# Patient Record
Sex: Female | Born: 1975 | Race: Black or African American | Hispanic: No | Marital: Single | Smoking: Never smoker
Health system: Southern US, Community
[De-identification: ages and names within clinical notes are randomized; demographics above are authoritative.]

---

## 2015-05-12 ENCOUNTER — Emergency Department (HOSPITAL_BASED_OUTPATIENT_CLINIC_OR_DEPARTMENT_OTHER)
Admission: EM | Admit: 2015-05-12 | Discharge: 2015-05-12 | Disposition: A | Discharge status: Home / Self Care | Payer: Self-pay | Attending: Emergency Medicine | Admitting: Emergency Medicine

## 2015-05-12 ENCOUNTER — Encounter (HOSPITAL_BASED_OUTPATIENT_CLINIC_OR_DEPARTMENT_OTHER): Payer: Self-pay | Admitting: Emergency Medicine

## 2015-05-12 ENCOUNTER — Emergency Department (HOSPITAL_BASED_OUTPATIENT_CLINIC_OR_DEPARTMENT_OTHER): Payer: Self-pay

## 2015-05-12 DIAGNOSIS — Z79899 Other long term (current) drug therapy: Secondary | ICD-10-CM | POA: Insufficient documentation

## 2015-05-12 DIAGNOSIS — R059 Cough, unspecified: Secondary | ICD-10-CM

## 2015-05-12 DIAGNOSIS — J40 Bronchitis, not specified as acute or chronic: Secondary | ICD-10-CM

## 2015-05-12 DIAGNOSIS — J209 Acute bronchitis, unspecified: Secondary | ICD-10-CM | POA: Insufficient documentation

## 2015-05-12 DIAGNOSIS — R05 Cough: Secondary | ICD-10-CM

## 2015-05-12 LAB — BASIC METABOLIC PANEL
ANION GAP: 7 (ref 5–15)
BUN: 9 mg/dL (ref 6–20)
CHLORIDE: 106 mmol/L (ref 101–111)
CO2: 25 mmol/L (ref 22–32)
Calcium: 8.9 mg/dL (ref 8.9–10.3)
Creatinine, Ser: 0.7 mg/dL (ref 0.44–1.00)
GFR calc Af Amer: >60 mL/min (ref >60)
GLUCOSE: 88 mg/dL (ref 65–99)
POTASSIUM: 3.8 mmol/L (ref 3.5–5.1)
Sodium: 138 mmol/L (ref 135–145)

## 2015-05-12 LAB — CBC WITH DIFFERENTIAL/PLATELET
BASOS ABS: 0 10*3/uL (ref 0.0–0.1)
Basophils Relative: 0 %
Eosinophils Absolute: 0.2 10*3/uL (ref 0.0–0.7)
Eosinophils Relative: 4 %
HEMATOCRIT: 41.8 % (ref 36.0–46.0)
Hemoglobin: 13.5 g/dL (ref 12.0–15.0)
LYMPHS ABS: 3.2 10*3/uL (ref 0.7–4.0)
LYMPHS PCT: 63 %
MCH: 28.6 pg (ref 26.0–34.0)
MCHC: 32.3 g/dL (ref 30.0–36.0)
MCV: 88.6 fL (ref 78.0–100.0)
MONO ABS: 0.5 10*3/uL (ref 0.1–1.0)
MONOS PCT: 9 %
NEUTROS ABS: 1.2 10*3/uL — AB (ref 1.7–7.7)
Neutrophils Relative %: 24 %
Platelets: 221 10*3/uL (ref 150–400)
RBC: 4.72 MIL/uL (ref 3.87–5.11)
RDW: 13.4 % (ref 11.5–15.5)
WBC: 5 10*3/uL (ref 4.0–10.5)

## 2015-05-12 MED ORDER — BENZONATATE 100 MG PO CAPS: 100.0000 mg | ORAL_CAPSULE | Freq: Three times a day (TID) | ORAL | Status: DC | PRN

## 2015-05-12 MED ORDER — IBUPROFEN 800 MG PO TABS: 800.0000 mg | ORAL_TABLET | Freq: Three times a day (TID) | ORAL | Status: DC | PRN

## 2015-05-12 MED ORDER — AZITHROMYCIN 250 MG PO TABS: 250.0000 mg | ORAL_TABLET | Freq: Every day | ORAL | Status: DC

## 2015-05-12 MED ORDER — ALBUTEROL SULFATE HFA 108 (90 BASE) MCG/ACT IN AERS
2.0000 | INHALATION_SPRAY | Freq: Once | RESPIRATORY_TRACT | Status: AC
  Administered 2015-05-12: 2 via RESPIRATORY_TRACT
  Filled 2015-05-12: qty 6.7

## 2015-05-12 MED ORDER — PREDNISONE 20 MG PO TABS: ORAL_TABLET | ORAL | Status: DC

## 2015-05-12 NOTE — ED Provider Notes (Signed)
CSN: [id number]     Arrival date & time 05/12/15  1342 History   First MD Initiated Contact with Patient 05/12/15 1511     Chief Complaint  Patient presents with  . Cough     (Consider location/radiation/quality/duration/timing/severity/associated sxs/prior Treatment) The history is provided by the patient.     Pt p/w dry cough x 2-3 months worse in the past week with addition of subjective fevers.  Denies other URI symptoms, hemoptysis, night sweats, leg swelling, recent immobilization, exogenous estrogen, recent travel.  Denies reflux hx. Denies allergies.  Her 40 year old daughter is currently also sick with cough but has only been sick this week.  Pt is originally from [place], has been in this country for 20 years.    History reviewed. No pertinent past medical history. History reviewed. No pertinent past surgical history. History reviewed. No pertinent family history. Social History  Substance Use Topics  . Smoking status: Never Smoker   . Smokeless tobacco: None  . Alcohol Use: No   OB History    No data available     Review of Systems  All other systems reviewed and are negative.     Allergies  Review of patient's allergies indicates no known allergies.  Home Medications   Prior to Admission medications   Medication Sig Start Date End Date Taking? Authorizing Provider  dextromethorphan-guaiFENesin (MUCINEX DM) 30-600 MG 12hr tablet Take 1 tablet by mouth 2 (two) times daily.   Yes Historical Provider, MD  sodium-potassium bicarbonate (ALKA-SELTZER GOLD) TBEF dissolvable tablet Take 1 tablet by mouth daily as needed.   Yes Historical Provider, MD   BP 129/79 mmHg  Pulse 80  Temp(Src) 98 F (36.7 C) (Oral)  Resp 20  Ht 5\' 2"  (1.575 m)  Wt 85.503 kg  BMI 34.47 kg/m2  SpO2 100%  LMP 04/14/2015 Physical Exam  Constitutional: She appears well-developed and well-nourished. No distress.  HENT:  Head: Normocephalic and atraumatic.  Neck: Neck supple.   Cardiovascular: Normal rate and regular rhythm.   Pulmonary/Chest: Effort normal and breath sounds normal. No respiratory distress. She has no wheezes. She has no rales.  Occasional cough  Abdominal: Soft. She exhibits no distension. There is no tenderness. There is no rebound and no guarding.  Musculoskeletal: She exhibits no edema.  Neurological: She is alert.  Skin: She is not diaphoretic.  Nursing note and vitals reviewed.   ED Course  Procedures (including critical care time) Labs Review Labs Reviewed  CBC WITH DIFFERENTIAL/PLATELET - Abnormal; Notable for the following:    Neutro Abs 1.2 (*)    All other components within normal limits  BASIC METABOLIC PANEL    Imaging Review Dg Chest 2 View  05/12/2015  CLINICAL DATA:  Cough, congestion for 2-3 months. EXAM: CHEST  2 VIEW COMPARISON:  None. FINDINGS: The heart size and mediastinal contours are within normal limits. Both lungs are clear. The visualized skeletal structures are unremarkable. IMPRESSION: No active cardiopulmonary disease. Electronically Signed   By: Charlett NoseKevin  Dover M.D.   On: 05/12/2015 14:24   I have personally reviewed and evaluated these images and lab results as part of my medical decision-making.   EKG Interpretation None       4:00 PM Discussed pt with Dr Madilyn Hookees who recommends basic labs, if negative f/u with health department for PPD.    MDM   Final diagnoses:  Cough  Bronchitis    Afebrile, nontoxic patient with 2-3 months of cough, now with subjective fever.  CXR, labs unremarkable.   D/C home with tessalon, albuterol, z-pak, prednisone, health department/PCP follow up.  Pt is originally from [place], has been here 20 years.  No night sweats, hemoptysis, fevers.  Pt is not ill appearing.  No risk factors for PE.  Discussed result, findings, treatment, and follow up  with patient.  Pt given return precautions.  Pt verbalizes understanding and agrees with plan.         Trixie Dredge,  PA-C 05/12/15 1723  Tilden Fossa, MD 05/13/15 [phone]

## 2015-05-12 NOTE — ED Notes (Signed)
Cough x 3 months

## 2015-05-12 NOTE — Discharge Instructions (Signed)
Read the information below.  Use the prescribed medication as directed.  Please discuss all new medications with your pharmacist.  You may return to the Emergency Department at any time for worsening condition or any new symptoms that concern you.   If you develop shortness of breath, wheezing, severe chest pain, or fevers despite using tylenol and/or ibuprofen, return for a recheck.       Cough, Adult Coughing is a reflex that clears your throat and your airways. Coughing helps to heal and protect your lungs. It is normal to cough occasionally, but a cough that happens with other symptoms or lasts a long time may be a sign of a condition that needs treatment. A cough may last only 2-3 weeks (acute), or it may last longer than 8 weeks (chronic). CAUSES Coughing is commonly caused by:  Breathing in substances that irritate your lungs.  A viral or bacterial respiratory infection.  Allergies.  Asthma.  Postnasal drip.  Smoking.  Acid backing up from the stomach into the esophagus (gastroesophageal reflux).  Certain medicines.  Chronic lung problems, including COPD (or rarely, lung cancer).  Other medical conditions such as heart failure. HOME CARE INSTRUCTIONS  Pay attention to any changes in your symptoms. Take these actions to help with your discomfort:  Take medicines only as told by your health care provider.  If you were prescribed an antibiotic medicine, take it as told by your health care provider. Do not stop taking the antibiotic even if you start to feel better.  Talk with your health care provider before you take a cough suppressant medicine.  Drink enough fluid to keep your urine clear or pale yellow.  If the air is dry, use a cold steam vaporizer or humidifier in your bedroom or your home to help loosen secretions.  Avoid anything that causes you to cough at work or at home.  If your cough is worse at night, try sleeping in a semi-upright position.  Avoid  cigarette smoke. If you smoke, quit smoking. If you need help quitting, ask your health care provider.  Avoid caffeine.  Avoid alcohol.  Rest as needed. SEEK MEDICAL CARE IF:   You have new symptoms.  You cough up pus.  Your cough does not get better after 2-3 weeks, or your cough gets worse.  You cannot control your cough with suppressant medicines and you are losing sleep.  You develop pain that is getting worse or pain that is not controlled with pain medicines.  You have a fever.  You have unexplained weight loss.  You have night sweats. SEEK IMMEDIATE MEDICAL CARE IF:  You cough up blood.  You have difficulty breathing.  Your heartbeat is very fast.   This information is not intended to replace advice given to you by your health care provider. Make sure you discuss any questions you have with your health care provider.   Document Released: 10/28/2010 Document Revised: 01/20/2015 Document Reviewed: 07/08/2014 Elsevier Interactive Patient Education [employer]2016 Elsevier Inc.

## 2015-06-05 ENCOUNTER — Emergency Department (HOSPITAL_BASED_OUTPATIENT_CLINIC_OR_DEPARTMENT_OTHER)
Admission: EM | Admit: 2015-06-05 | Discharge: 2015-06-05 | Disposition: A | Discharge status: Home / Self Care | Payer: Self-pay | Attending: Emergency Medicine | Admitting: Emergency Medicine

## 2015-06-05 ENCOUNTER — Encounter (HOSPITAL_BASED_OUTPATIENT_CLINIC_OR_DEPARTMENT_OTHER): Payer: Self-pay | Admitting: *Deleted

## 2015-06-05 ENCOUNTER — Emergency Department (HOSPITAL_BASED_OUTPATIENT_CLINIC_OR_DEPARTMENT_OTHER): Payer: Self-pay

## 2015-06-05 DIAGNOSIS — R053 Chronic cough: Secondary | ICD-10-CM

## 2015-06-05 DIAGNOSIS — R05 Cough: Secondary | ICD-10-CM | POA: Insufficient documentation

## 2015-06-05 DIAGNOSIS — Z79899 Other long term (current) drug therapy: Secondary | ICD-10-CM | POA: Insufficient documentation

## 2015-06-05 MED ORDER — ALBUTEROL SULFATE HFA 108 (90 BASE) MCG/ACT IN AERS
2.0000 | INHALATION_SPRAY | Freq: Once | RESPIRATORY_TRACT | Status: AC
  Administered 2015-06-05: 2 via RESPIRATORY_TRACT
  Filled 2015-06-05: qty 6.7

## 2015-06-05 MED ORDER — LIDOCAINE VISCOUS 2 % MT SOLN
15.0000 mL | Freq: Once | OROMUCOSAL | Status: AC
  Administered 2015-06-05: 15 mL via OROMUCOSAL
  Filled 2015-06-05: qty 15

## 2015-06-05 MED ORDER — ALBUTEROL SULFATE HFA 108 (90 BASE) MCG/ACT IN AERS: 1.0000 | INHALATION_SPRAY | Freq: Four times a day (QID) | RESPIRATORY_TRACT | Status: DC | PRN

## 2015-06-05 MED ORDER — PANTOPRAZOLE SODIUM 20 MG PO TBEC: 40.0000 mg | DELAYED_RELEASE_TABLET | Freq: Every day | ORAL | Status: DC

## 2015-06-05 NOTE — ED Notes (Signed)
Pt reports chronic cough x5 mos. Reports being seen 2-3 wks ago and taking a course of antibiotics. Denies fever; reports intermittent sob and recent nasal congestion.

## 2015-06-05 NOTE — ED Provider Notes (Signed)
CSN: [id number]     Arrival date & time 06/05/15  1328 History   First MD Initiated Contact with Patient 06/05/15 1419     Chief Complaint  Patient presents with  . Cough     (Consider location/radiation/quality/duration/timing/severity/associated sxs/prior Treatment) Patient is a 40 y.o. female presenting with cough.  Cough Cough characteristics:  Productive Sputum characteristics:  Clear Severity:  Severe Onset quality:  Gradual Duration: 5 months. Timing:  Constant Progression:  Worsening Chronicity:  Chronic Context: upper respiratory infection (if coughing a lot, runny nose, sore throat)   Context: not sick contacts   Context comment:  No TB exposures, no recent travel/long flight Relieved by: 5 days of steroids. Worsened by:  Nothing tried Ineffective treatments:  None tried Associated symptoms: no chest pain, no ear pain, no fever, no headaches, no rash, no rhinorrhea, no shortness of breath, no sinus congestion, no sore throat, no weight loss and no wheezing     History reviewed. No pertinent past medical history. Past Surgical History  Procedure Laterality Date  . Cesarean section  2009   No family history on file. Social History  Substance Use Topics  . Smoking status: Never Smoker   . Smokeless tobacco: None  . Alcohol Use: No   OB History    No data available     Review of Systems  Constitutional: Negative for fever and weight loss.  HENT: Negative for ear pain, rhinorrhea and sore throat.   Eyes: Negative for visual disturbance.  Respiratory: Positive for cough. Negative for shortness of breath and wheezing.   Cardiovascular: Negative for chest pain.  Gastrointestinal: Negative for nausea, vomiting and abdominal pain.  Genitourinary: Negative for difficulty urinating.  Musculoskeletal: Negative for back pain and neck pain.  Skin: Negative for rash.  Neurological: Negative for syncope and headaches.      Allergies  Review of patient's allergies  indicates no known allergies.  Home Medications   Prior to Admission medications   Medication Sig Start Date End Date Taking? Authorizing Provider  benzonatate (TESSALON) 100 MG capsule Take 1 capsule (100 mg total) by mouth 3 (three) times daily as needed for cough. 05/12/15  Yes Trixie Dredge, PA-C  albuterol (PROVENTIL HFA;VENTOLIN HFA) 108 (90 Base) MCG/ACT inhaler Inhale 1-2 puffs into the lungs every 6 (six) hours as needed for wheezing or shortness of breath. 06/05/15   Alvira Monday, MD  azithromycin (ZITHROMAX) 250 MG tablet Take 1 tablet (250 mg total) by mouth daily. Take first 2 tablets together, then 1 every day until finished. 05/12/15   Trixie Dredge, PA-C  dextromethorphan-guaiFENesin [facility] DM) 30-600 MG 12hr tablet Take 1 tablet by mouth 2 (two) times daily.    Historical Provider, MD  ibuprofen (ADVIL,MOTRIN) 800 MG tablet Take 1 tablet (800 mg total) by mouth every 8 (eight) hours as needed for mild pain or moderate pain. 05/12/15   Trixie Dredge, PA-C  pantoprazole (PROTONIX) 20 MG tablet Take 2 tablets (40 mg total) by mouth daily. 06/05/15   Alvira Monday, MD  predniSONE (DELTASONE) 20 MG tablet 2 tabs po daily x 5 days 05/12/15   Trixie Dredge, PA-C  sodium-potassium bicarbonate (ALKA-SELTZER GOLD) TBEF dissolvable tablet Take 1 tablet by mouth daily as needed.    Historical Provider, MD   BP 123/77 mmHg  Pulse 96  Temp(Src) 98.1 F (36.7 C) (Oral)  Resp 16  Ht  (1.651 m)  Wt 188 lb (85.276 kg)  BMI 31.28 kg/m2  SpO2 96%  LMP 05/08/2015 (Approximate)  Physical Exam  Constitutional: She is oriented to person, place, and time. She appears well-developed and well-nourished. No distress.  HENT:  Head: Normocephalic and atraumatic.  Eyes: Conjunctivae and EOM are normal.  Neck: Normal range of motion.  Cardiovascular: Normal rate, regular rhythm, normal heart sounds and intact distal pulses.  Exam reveals no gallop and no friction rub.   No murmur  heard. Pulmonary/Chest: Effort normal and breath sounds normal. No respiratory distress. She has no wheezes. She has no rales.  Frequent cough   Abdominal: Soft. She exhibits no distension. There is no tenderness. There is no guarding.  Musculoskeletal: She exhibits no edema or tenderness.  Neurological: She is alert and oriented to person, place, and time.  Skin: Skin is warm and dry. No rash noted. She is not diaphoretic. No erythema.  Nursing note and vitals reviewed.   ED Course  Procedures (including critical care time) Labs Review Labs Reviewed - No data to display  Imaging Review Dg Chest 2 View  06/05/2015  CLINICAL DATA:  Chronic cough for 5 months, took antibiotics 2-3 weeks ago, no fever, no improvement with symptoms, intermittent shortness of breath EXAM: CHEST  2 VIEW COMPARISON:  05/12/2015 FINDINGS: The heart size and mediastinal contours are within normal limits. Both lungs are clear. The visualized skeletal structures are unremarkable. IMPRESSION: No active cardiopulmonary disease. Electronically Signed   By: Esperanza Heir M.D.   On: 06/05/2015 14:58   I have personally reviewed and evaluated these images and lab results as part of my medical decision-making.   EKG Interpretation None      MDM   Final diagnoses:  Chronic cough   40 year old female with no significant medical history presents with concern of cough for 5 months. Patient was seen previously in the emergency department, given prednisone, azithromycin and Tessalon Perles, and reports that her symptoms temporarily had improved.  Patient does not have any signs of CHF on exam, history does not suggest pulmonary embolus.  Have low suspicion for tuberculosis, given patient has a clear chest x-ray, no fevers, no night sweats, no weight loss, no known contact with TB, and [place].  Patient has a sister with asthma, and given chronic cough with bronchospastic cough as  possibility, she was given an albuterol inhaler. In addition patient was given a prescription for Protonix given possibility of reflux as cause as cough and throat irritation, worse at night. Recommended close PCP follow-up. Patient discharged in stable condition with understanding of reasons to return.    Alvira Monday, MD 06/05/15 2210

## 2015-11-12 ENCOUNTER — Emergency Department (HOSPITAL_BASED_OUTPATIENT_CLINIC_OR_DEPARTMENT_OTHER)
Admission: EM | Admit: 2015-11-12 | Discharge: 2015-11-12 | Disposition: A | Discharge status: Home / Self Care | Payer: Self-pay | Attending: Emergency Medicine | Admitting: Emergency Medicine

## 2015-11-12 ENCOUNTER — Encounter (HOSPITAL_BASED_OUTPATIENT_CLINIC_OR_DEPARTMENT_OTHER): Payer: Self-pay | Admitting: *Deleted

## 2015-11-12 ENCOUNTER — Emergency Department (HOSPITAL_BASED_OUTPATIENT_CLINIC_OR_DEPARTMENT_OTHER): Payer: Self-pay

## 2015-11-12 DIAGNOSIS — R1032 Left lower quadrant pain: Secondary | ICD-10-CM

## 2015-11-12 DIAGNOSIS — B373 Candidiasis of vulva and vagina: Secondary | ICD-10-CM | POA: Insufficient documentation

## 2015-11-12 DIAGNOSIS — B3731 Acute candidiasis of vulva and vagina: Secondary | ICD-10-CM

## 2015-11-12 DIAGNOSIS — B9689 Other specified bacterial agents as the cause of diseases classified elsewhere: Secondary | ICD-10-CM

## 2015-11-12 DIAGNOSIS — N760 Acute vaginitis: Secondary | ICD-10-CM | POA: Insufficient documentation

## 2015-11-12 DIAGNOSIS — N3090 Cystitis, unspecified without hematuria: Secondary | ICD-10-CM | POA: Insufficient documentation

## 2015-11-12 LAB — COMPREHENSIVE METABOLIC PANEL
ALK PHOS: 59 U/L (ref 38–126)
ALT: 17 U/L (ref 14–54)
ANION GAP: 6 (ref 5–15)
AST: 25 U/L (ref 15–41)
Albumin: 4.2 g/dL (ref 3.5–5.0)
BILIRUBIN TOTAL: 0.6 mg/dL (ref 0.3–1.2)
BUN: 10 mg/dL (ref 6–20)
CALCIUM: 9.1 mg/dL (ref 8.9–10.3)
CO2: 25 mmol/L (ref 22–32)
Chloride: 106 mmol/L (ref 101–111)
Creatinine, Ser: 0.88 mg/dL (ref 0.44–1.00)
Glucose, Bld: 101 mg/dL — ABNORMAL HIGH (ref 65–99)
POTASSIUM: 3.5 mmol/L (ref 3.5–5.1)
Sodium: 137 mmol/L (ref 135–145)
TOTAL PROTEIN: 7 g/dL (ref 6.5–8.1)

## 2015-11-12 LAB — CBC WITH DIFFERENTIAL/PLATELET
BASOS ABS: 0 10*3/uL (ref 0.0–0.1)
BASOS PCT: 0 %
Eosinophils Absolute: 0.3 10*3/uL (ref 0.0–0.7)
Eosinophils Relative: 4 %
HEMATOCRIT: 39.6 % (ref 36.0–46.0)
HEMOGLOBIN: 13.3 g/dL (ref 12.0–15.0)
LYMPHS PCT: 44 %
Lymphs Abs: 2.7 10*3/uL (ref 0.7–4.0)
MCH: 29.6 pg (ref 26.0–34.0)
MCHC: 33.6 g/dL (ref 30.0–36.0)
MCV: 88.2 fL (ref 78.0–100.0)
MONO ABS: 0.5 10*3/uL (ref 0.1–1.0)
Monocytes Relative: 7 %
NEUTROS ABS: 2.8 10*3/uL (ref 1.7–7.7)
NEUTROS PCT: 45 %
Platelets: 205 10*3/uL (ref 150–400)
RBC: 4.49 MIL/uL (ref 3.87–5.11)
RDW: 13.9 % (ref 11.5–15.5)
WBC: 6.2 10*3/uL (ref 4.0–10.5)

## 2015-11-12 LAB — WET PREP, GENITAL
Sperm: NONE SEEN
TRICH WET PREP: NONE SEEN

## 2015-11-12 LAB — URINALYSIS, ROUTINE W REFLEX MICROSCOPIC
BILIRUBIN URINE: NEGATIVE
GLUCOSE, UA: NEGATIVE mg/dL
KETONES UR: NEGATIVE mg/dL
Nitrite: NEGATIVE
PH: 5.5 (ref 5.0–8.0)
PROTEIN: 30 mg/dL — AB
Specific Gravity, Urine: 1.024 (ref 1.005–1.030)

## 2015-11-12 LAB — URINE MICROSCOPIC-ADD ON

## 2015-11-12 LAB — PREGNANCY, URINE: Preg Test, Ur: NEGATIVE

## 2015-11-12 MED ORDER — PROBIOTIC & ACIDOPHILUS EX ST PO CAPS: 1.0000 | ORAL_CAPSULE | Freq: Every day | ORAL | Status: DC

## 2015-11-12 MED ORDER — SODIUM CHLORIDE 0.9 % IV BOLUS (SEPSIS): 1000.0000 mL | Freq: Once | INTRAVENOUS | Status: DC

## 2015-11-12 MED ORDER — SULFAMETHOXAZOLE-TRIMETHOPRIM 800-160 MG PO TABS
1.0000 | ORAL_TABLET | Freq: Once | ORAL | Status: AC
  Administered 2015-11-12: 1 via ORAL
  Filled 2015-11-12: qty 1

## 2015-11-12 MED ORDER — FLUCONAZOLE 50 MG PO TABS
150.0000 mg | ORAL_TABLET | Freq: Once | ORAL | Status: AC
  Administered 2015-11-12: 150 mg via ORAL
  Filled 2015-11-12: qty 1

## 2015-11-12 MED ORDER — METRONIDAZOLE 500 MG PO TABS: 500.0000 mg | ORAL_TABLET | Freq: Two times a day (BID) | ORAL | Status: DC

## 2015-11-12 MED ORDER — METRONIDAZOLE 500 MG PO TABS
500.0000 mg | ORAL_TABLET | Freq: Once | ORAL | Status: AC
  Administered 2015-11-12: 500 mg via ORAL
  Filled 2015-11-12: qty 1

## 2015-11-12 MED ORDER — SULFAMETHOXAZOLE-TRIMETHOPRIM 800-160 MG PO TABS: 1.0000 | ORAL_TABLET | Freq: Two times a day (BID) | ORAL | Status: AC

## 2015-11-12 NOTE — ED Notes (Signed)
Back and abdominal pain. Dysuria since last night.

## 2015-11-12 NOTE — ED Provider Notes (Signed)
CSN: [id number]     Arrival date & time 11/12/15  1505 History   First MD Initiated Contact with Patient 11/12/15 1545     Chief Complaint  Patient presents with  . Abdominal Pain   PT IS A 40 YO BF WHO C/O ABDOMINAL PAIN.  PT C/O LLQ PAIN WITH PAIN RADIATING TO HER BACK.  THE PT DENIES ANY N/V. THE PT DOES C/O DYSURIA.  PT'S LMP WAS 5/4.  SHE STARTED SPOTTING TODAY.  (Consider location/radiation/quality/duration/timing/severity/associated sxs/prior Treatment) Patient is a 40 y.o. female presenting with abdominal pain. The history is provided by the patient.  Abdominal Pain Pain location:  LLQ Pain quality: sharp   Pain radiates to:  L flank Pain severity:  Moderate Onset quality:  Sudden Timing:  Constant Progression:  Unchanged   History reviewed. No pertinent past medical history. Past Surgical History  Procedure Laterality Date  . Cesarean section  2009   No family history on file. Social History  Substance Use Topics  . Smoking status: Never Smoker   . Smokeless tobacco: None  . Alcohol Use: No   OB History    No data available     Review of Systems  Gastrointestinal: Positive for abdominal pain.  All other systems reviewed and are negative.     Allergies  Review of patient's allergies indicates no known allergies.  Home Medications   Prior to Admission medications   Medication Sig Start Date End Date Taking? Authorizing Provider  albuterol (PROVENTIL HFA;VENTOLIN HFA) 108 (90 Base) MCG/ACT inhaler Inhale 1-2 puffs into the lungs every 6 (six) hours as needed for wheezing or shortness of breath. 06/05/15   Alvira Monday, MD  azithromycin (ZITHROMAX) 250 MG tablet Take 1 tablet (250 mg total) by mouth daily. Take first 2 tablets together, then 1 every day until finished. 05/12/15   Trixie Dredge, PA-C  benzonatate (TESSALON) 100 MG capsule Take 1 capsule (100 mg total) by mouth 3 (three) times daily as needed for cough. 05/12/15   Trixie Dredge, PA-C   dextromethorphan-guaiFENesin [facility] DM) 30-600 MG 12hr tablet Take 1 tablet by mouth 2 (two) times daily.    Historical Provider, MD  ibuprofen (ADVIL,MOTRIN) 800 MG tablet Take 1 tablet (800 mg total) by mouth every 8 (eight) hours as needed for mild pain or moderate pain. 05/12/15   Trixie Dredge, PA-C  metroNIDAZOLE (FLAGYL) 500 MG tablet Take 1 tablet (500 mg total) by mouth 2 (two) times daily. 11/12/15   Jacalyn Lefevre, MD  pantoprazole (PROTONIX) 20 MG tablet Take 2 tablets (40 mg total) by mouth daily. 06/05/15   Alvira Monday, MD  predniSONE (DELTASONE) 20 MG tablet 2 tabs po daily x 5 days 05/12/15   Trixie Dredge, PA-C  Probiotic Product (PROBIOTIC & ACIDOPHILUS EX ST) CAPS Take 1 capsule by mouth daily. 11/12/15   Jacalyn Lefevre, MD  sodium-potassium bicarbonate (ALKA-SELTZER GOLD) TBEF dissolvable tablet Take 1 tablet by mouth daily as needed.    Historical Provider, MD  sulfamethoxazole-trimethoprim (BACTRIM DS,SEPTRA DS) 800-160 MG tablet Take 1 tablet by mouth 2 (two) times daily. 11/12/15 11/19/15  Jacalyn Lefevre, MD   BP 108/80 mmHg  Pulse 66  Temp(Src) 98.3 F (36.8 C) (Oral)  Resp 18  Ht  (1.676 m)  Wt 188 lb (85.276 kg)  BMI 30.36 kg/m2  SpO2 100%  LMP 09/17/2015 Physical Exam  Constitutional: She is oriented to person, place, and time. She appears well-developed and well-nourished.  HENT:  Head: Normocephalic and atraumatic.  Right Ear:  External ear normal.  Left Ear: External ear normal.  Nose: Nose normal.  Mouth/Throat: Oropharynx is clear and moist.  Eyes: Conjunctivae and EOM are normal. Pupils are equal, round, and reactive to light.  Neck: Normal range of motion. Neck supple.  Cardiovascular: Normal rate, regular rhythm, normal heart sounds and intact distal pulses.   Pulmonary/Chest: Effort normal and breath sounds normal.  Abdominal: Soft. Bowel sounds are normal. There is tenderness in the suprapubic area and left lower quadrant.  Genitourinary: Uterus  is tender. Left adnexum displays tenderness. There is bleeding in the vagina.  Musculoskeletal: Normal range of motion.  Neurological: She is alert and oriented to person, place, and time.  Skin: Skin is warm and dry.  Psychiatric: She has a normal mood and affect. Her behavior is normal. Judgment and thought content normal.  Nursing note and vitals reviewed.   ED Course  Procedures (including critical care time) Labs Review Labs Reviewed  WET PREP, GENITAL - Abnormal; Notable for the following:    Yeast Wet Prep HPF POC PRESENT (*)    Clue Cells Wet Prep HPF POC PRESENT (*)    WBC, Wet Prep HPF POC MODERATE (*)    All other components within normal limits  URINALYSIS, ROUTINE W REFLEX MICROSCOPIC (NOT AT [facility]) - Abnormal; Notable for the following:    Color, Urine AMBER (*)    APPearance CLOUDY (*)    Hgb urine dipstick LARGE (*)    Protein, ur 30 (*)    Leukocytes, UA MODERATE (*)    All other components within normal limits  COMPREHENSIVE METABOLIC PANEL - Abnormal; Notable for the following:    Glucose, Bld 101 (*)    All other components within normal limits  URINE MICROSCOPIC-ADD ON - Abnormal; Notable for the following:    Squamous Epithelial / LPF 0-5 (*)    Bacteria, UA FEW (*)    All other components within normal limits  PREGNANCY, URINE  CBC WITH DIFFERENTIAL/PLATELET  GC/CHLAMYDIA PROBE AMP ([facility]) NOT AT [facility]    Imaging Review Ct Renal Stone Study  11/12/2015  CLINICAL DATA:  Left lower quadrant pain EXAM: CT ABDOMEN AND PELVIS WITHOUT CONTRAST TECHNIQUE: Multidetector CT imaging of the abdomen and pelvis was performed following the standard protocol without IV contrast. COMPARISON:  None. FINDINGS: Lower chest:  Lung bases clear.  Heart size normal Hepatobiliary: Liver normal in size and contour without focal lesion. Gallbladder and bile ducts normal. Pancreas: Negative Spleen: Negative Adrenals/Urinary Tract: No renal calculi. No renal obstruction or mass.  Thickening of the urinary bladder may represent cystitis. Stomach/Bowel: Stomach and duodenum normal. Negative for bowel obstruction. Normal appendix. Negative for diverticulitis. No colonic edema. Vascular/Lymphatic: Normal aorta and IVC.  No lymphadenopathy. Reproductive: Prior C-section with scarring in the lower uterine segment. No pelvic mass lesion. 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 No edema in the hernia. Musculoskeletal: No skeletal abnormality.  Normal lumbar spine IMPRESSION: Negative for urinary tract calculi. Bladder wall thickening compatible with cystitis. Normal appendix.  Negative for diverticulitis. Ventral hernia in the midline without acute abnormality. Electronically Signed   By: Marlan Palauharles  Clark M.D.   On: 11/12/2015 18:04   I have personally reviewed and evaluated these images and lab results as part of my medical decision-making.   EKG Interpretation None      MDM  Pt is feeling better.  [facility]  She knows to return if worse. Final  diagnoses:  Cystitis  Bacterial vaginosis  Vaginal candidiasis  Left lower quadrant pain        Jacalyn LefevreJulie Torey Reinard, MD 11/12/15 [phone]

## 2015-11-12 NOTE — Discharge Instructions (Signed)

## 2015-11-12 NOTE — ED Notes (Signed)
Pt reports LLQ pain radiating to her back since yesterday. Pt reports a missed period earlier this month but reports some spotting started today. Reports nausea, no vomiting, some burning with urination.

## 2015-11-12 NOTE — ED Notes (Signed)
Pt given gingerale and crackers 

## 2015-11-15 LAB — GC/CHLAMYDIA PROBE AMP (~~LOC~~) NOT AT ARMC
Chlamydia: NEGATIVE
NEISSERIA GONORRHEA: NEGATIVE

## 2016-10-24 ENCOUNTER — Encounter (HOSPITAL_BASED_OUTPATIENT_CLINIC_OR_DEPARTMENT_OTHER): Payer: Self-pay | Admitting: Emergency Medicine

## 2016-10-24 ENCOUNTER — Emergency Department (HOSPITAL_BASED_OUTPATIENT_CLINIC_OR_DEPARTMENT_OTHER)
Admission: EM | Admit: 2016-10-24 | Discharge: 2016-10-24 | Disposition: A | Discharge status: Home / Self Care | Payer: Self-pay | Attending: Emergency Medicine | Admitting: Emergency Medicine

## 2016-10-24 DIAGNOSIS — R300 Dysuria: Secondary | ICD-10-CM | POA: Insufficient documentation

## 2016-10-24 DIAGNOSIS — N730 Acute parametritis and pelvic cellulitis: Secondary | ICD-10-CM

## 2016-10-24 DIAGNOSIS — N739 Female pelvic inflammatory disease, unspecified: Secondary | ICD-10-CM | POA: Insufficient documentation

## 2016-10-24 LAB — URINALYSIS, ROUTINE W REFLEX MICROSCOPIC
BILIRUBIN URINE: NEGATIVE
GLUCOSE, UA: NEGATIVE mg/dL
Ketones, ur: NEGATIVE mg/dL
Nitrite: NEGATIVE
PH: 6 (ref 5.0–8.0)
Protein, ur: NEGATIVE mg/dL
SPECIFIC GRAVITY, URINE: 1.014 (ref 1.005–1.030)

## 2016-10-24 LAB — URINALYSIS, MICROSCOPIC (REFLEX)

## 2016-10-24 LAB — WET PREP, GENITAL
Sperm: NONE SEEN
Trich, Wet Prep: NONE SEEN

## 2016-10-24 LAB — PREGNANCY, URINE: PREG TEST UR: NEGATIVE

## 2016-10-24 MED ORDER — CEFTRIAXONE SODIUM 250 MG IJ SOLR
250.0000 mg | Freq: Once | INTRAMUSCULAR | Status: AC
  Administered 2016-10-24: 250 mg via INTRAMUSCULAR
  Filled 2016-10-24: qty 250

## 2016-10-24 MED ORDER — HYDROCODONE-ACETAMINOPHEN 5-325 MG PO TABS
1.0000 | ORAL_TABLET | Freq: Once | ORAL | Status: AC
  Administered 2016-10-24: 1 via ORAL
  Filled 2016-10-24: qty 1

## 2016-10-24 MED ORDER — DOXYCYCLINE HYCLATE 100 MG PO CAPS: 100.0000 mg | ORAL_CAPSULE | Freq: Two times a day (BID) | ORAL | 0 refills | Status: AC

## 2016-10-24 MED ORDER — METRONIDAZOLE 500 MG PO TABS: 500.0000 mg | ORAL_TABLET | Freq: Two times a day (BID) | ORAL | 0 refills | Status: AC

## 2016-10-24 MED ORDER — FLUCONAZOLE 100 MG PO TABS
150.0000 mg | ORAL_TABLET | Freq: Once | ORAL | Status: AC
  Administered 2016-10-24: 17:00:00 150 mg via ORAL
  Filled 2016-10-24: qty 1

## 2016-10-24 MED ORDER — IBUPROFEN 600 MG PO TABS: 600.0000 mg | ORAL_TABLET | Freq: Four times a day (QID) | ORAL | 0 refills | Status: AC | PRN

## 2016-10-24 NOTE — Discharge Instructions (Signed)

## 2016-10-24 NOTE — ED Provider Notes (Signed)
[facility] Provider Note   CSN: [id number] Arrival date & time: 10/24/16  1618     History   Chief Complaint Chief Complaint  Patient presents with  . Dysuria  . Abdominal Pain    HPI Colleen Meza is a 41 y.o. female.  HPI   41 yo F with PMHx as below here with a 3-week history of lower abd pain and vaginal discharge with dysuria. Sx started 3 weeks ago with mild burning with urination and lower abdominal pain. Over the past few weeks, she has had persistent, worsening pain with associated foul-smelling vaginal discharge and mild dyspareunia. She has h/o UTIs but denies h/o STDs, though she is sexually active. No bleeding. Pain worse with urination, no alleviating factors. No fevers. No RUQ pain.  History reviewed. No pertinent past medical history.  There are no active problems to display for this patient.   Past Surgical History:  Procedure Laterality Date  . CESAREAN SECTION  2009    OB History    No data available       Home Medications    Prior to Admission medications   Medication Sig Start Date End Date Taking? Authorizing Provider  dextromethorphan-guaiFENesin (MUCINEX DM) 30-600 MG 12hr tablet Take 1 tablet by mouth 2 (two) times daily.    [provider]  doxycycline (VIBRAMYCIN) 100 MG capsule Take 1 capsule (100 mg total) by mouth 2 (two) times daily. 10/24/16 11/07/16  Shaune PollackIsaacs, Yavuz Kirby, MD  ibuprofen (ADVIL,MOTRIN) 600 MG tablet Take 1 tablet (600 mg total) by mouth every 6 (six) hours as needed. 10/24/16   Shaune PollackIsaacs, Indy Kuck, MD  metroNIDAZOLE (FLAGYL) 500 MG tablet Take 1 tablet (500 mg total) by mouth 2 (two) times daily. 10/24/16 11/07/16  Shaune PollackIsaacs, Arnola Crittendon, MD  sodium-potassium bicarbonate (ALKA-SELTZER GOLD) TBEF dissolvable tablet Take 1 tablet by mouth daily as needed.    [provider]    Family History No family history on file.  Social History Social History  Substance Use Topics  . Smoking status: Never  Smoker  . Smokeless tobacco: Never Used  . Alcohol use No     Allergies   Patient has no known allergies.   Review of Systems Review of Systems  Constitutional: Negative for chills and fever.  HENT: Negative for congestion, rhinorrhea and sore throat.   Eyes: Negative for visual disturbance.  Respiratory: Negative for cough, shortness of breath and wheezing.   Cardiovascular: Negative for chest pain and leg swelling.  Gastrointestinal: Positive for abdominal pain. Negative for diarrhea, nausea and vomiting.  Genitourinary: Positive for dysuria, frequency, vaginal discharge and vaginal pain. Negative for flank pain and vaginal bleeding.  Musculoskeletal: Negative for neck pain.  Skin: Negative for rash.  Allergic/Immunologic: Negative for immunocompromised state.  Neurological: Negative for syncope and headaches.  Hematological: Does not bruise/bleed easily.  All other systems reviewed and are negative.    Physical Exam Updated Vital Signs BP 121/72 (BP Location: Right Arm)   Pulse 73   Temp 98.4 F (36.9 C) (Oral)   Resp 18   Wt 82.1 kg (181 lb)   LMP 07/24/2016   SpO2 100%   BMI 29.21 kg/m   Physical Exam  Constitutional: She is oriented to person, place, and time. She appears well-developed and well-nourished. No distress.  HENT:  Head: Normocephalic and atraumatic.  Eyes: Conjunctivae are normal.  Neck: Neck supple.  Cardiovascular: Normal rate, regular rhythm and normal heart sounds.  Exam reveals no friction rub.   No murmur  heard. Pulmonary/Chest: Effort normal and breath sounds normal. No respiratory distress. She has no wheezes. She has no rales.  Abdominal: Soft. She exhibits no distension.  Genitourinary:  Genitourinary Comments: Moderate suprapubic TTP. On GU exam, there is marked vaginal and cervical erythema with purulent discharge, multiple yellow lesions across cervix, and marked CMT. No adnexal TTP.  Musculoskeletal: She exhibits no edema.    Neurological: She is alert and oriented to person, place, and time. She exhibits normal muscle tone.  Skin: Skin is warm. Capillary refill takes less than 2 seconds.  Psychiatric: She has a normal mood and affect.  Nursing note and vitals reviewed.    ED Treatments / Results  Labs (all labs ordered are listed, but only abnormal results are displayed) Labs Reviewed  WET PREP, GENITAL - Abnormal; Notable for the following:       Result Value   Yeast Wet Prep HPF POC PRESENT (*)    Clue Cells Wet Prep HPF POC PRESENT (*)    WBC, Wet Prep HPF POC MANY (*)    All other components within normal limits  URINALYSIS, ROUTINE W REFLEX MICROSCOPIC - Abnormal; Notable for the following:    Hgb urine dipstick MODERATE (*)    Leukocytes, UA MODERATE (*)    All other components within normal limits  URINALYSIS, MICROSCOPIC (REFLEX) - Abnormal; Notable for the following:    Bacteria, UA RARE (*)    Squamous Epithelial / LPF 0-5 (*)    All other components within normal limits  URINE CULTURE  PREGNANCY, URINE  HIV ANTIBODY (ROUTINE TESTING)  GC/CHLAMYDIA PROBE AMP ([place]) NOT AT [facility]    EKG  EKG Interpretation None       Radiology No results found.  Procedures Procedures (including critical care time)  Medications Ordered in ED Medications  fluconazole (DIFLUCAN) tablet 150 mg (not administered)  cefTRIAXone (ROCEPHIN) injection 250 mg (250 mg Intramuscular Given 10/24/16 1715)  HYDROcodone-acetaminophen (NORCO/VICODIN) 5-325 MG per tablet 1 tablet (1 tablet Oral Given 10/24/16 1721)     Initial Impression / Assessment and Plan / ED Course  I have reviewed the triage vital signs and the nursing notes.  Pertinent labs & imaging results that were available during my care of the patient were reviewed by me and considered in my medical decision making (see chart for details).    41 yo F with PMH as above here with dysuria, pelvic pain. Exam and history is most c/w acute  PID. UA with +pyuria but exam, history is much more c/w PID and she has no CVAT b/l, no signs of pyelo. No fever, vomiting, or signs of systemic illness or sepsis. Pt given Rocephin IM here, will treat with doxy/flagyl given chronicity of complaints. Wet prep + yeast, WBCs, and clue cells - no trich. Diflucan given.  Final Clinical Impressions(s) / ED Diagnoses   Final diagnoses:  PID (acute pelvic inflammatory disease)    New Prescriptions New Prescriptions   DOXYCYCLINE (VIBRAMYCIN) 100 MG CAPSULE    Take 1 capsule (100 mg total) by mouth 2 (two) times daily.   IBUPROFEN (ADVIL,MOTRIN) 600 MG TABLET    Take 1 tablet (600 mg total) by mouth every 6 (six) hours as needed.   METRONIDAZOLE (FLAGYL) 500 MG TABLET    Take 1 tablet (500 mg total) by mouth 2 (two) times daily.     Shaune Pollack, MD 10/24/16 [phone]

## 2016-10-24 NOTE — ED Triage Notes (Signed)
Dysuria today with vaginal discharge and abd cramping.

## 2016-10-25 LAB — GC/CHLAMYDIA PROBE AMP (~~LOC~~) NOT AT ARMC
Chlamydia: NEGATIVE
NEISSERIA GONORRHEA: NEGATIVE

## 2016-10-25 LAB — URINE CULTURE
Culture: 40000 — AB
Special Requests: NORMAL

## 2016-10-25 LAB — HIV ANTIBODY (ROUTINE TESTING W REFLEX): HIV SCREEN 4TH GENERATION: NONREACTIVE

## 2017-05-02 IMAGING — CT CT RENAL STONE PROTOCOL
2 of 4 series · 17 of 46 positions shown, 19 images · non-contrast
Comparison: None.

CLINICAL DATA: Left lower quadrant pain

EXAM:
CT ABDOMEN AND PELVIS WITHOUT CONTRAST
TECHNIQUE: Multidetector CT imaging of the abdomen and pelvis was performed
following the standard protocol without IV contrast.

[Series 2: axial st · axial · 0.90mm/px · z∈[-415,+35]mm · 14 of 98 slices shown, 16 images]
[im 4/98  soft-tissue]
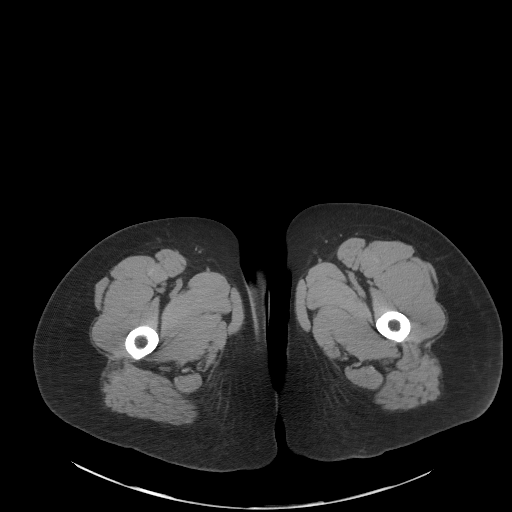
[im 4/98  bone]
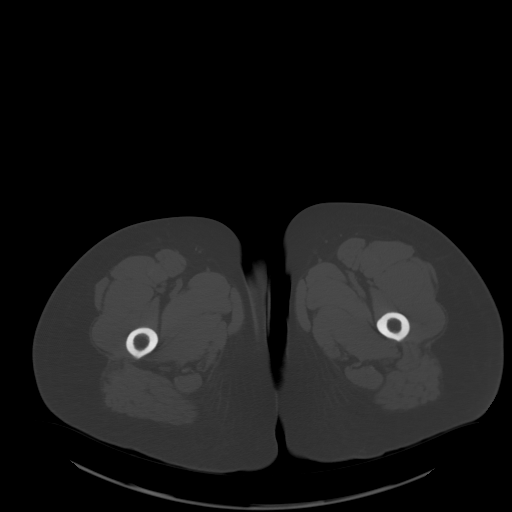
[im 12/98  soft-tissue]
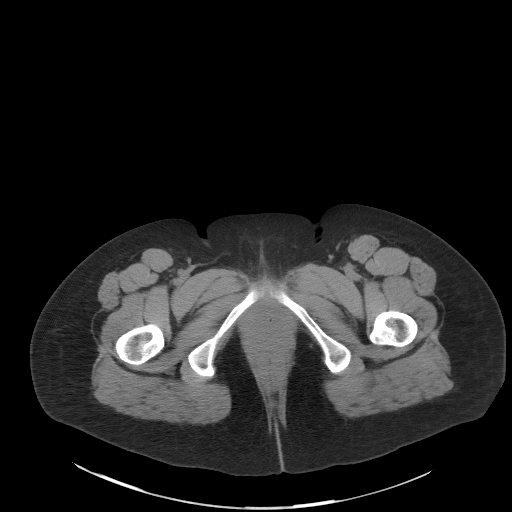
[im 20/98  soft-tissue]
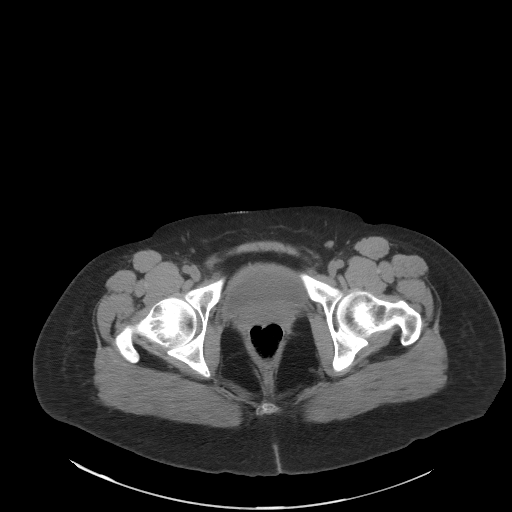
[im 28/98  soft-tissue]
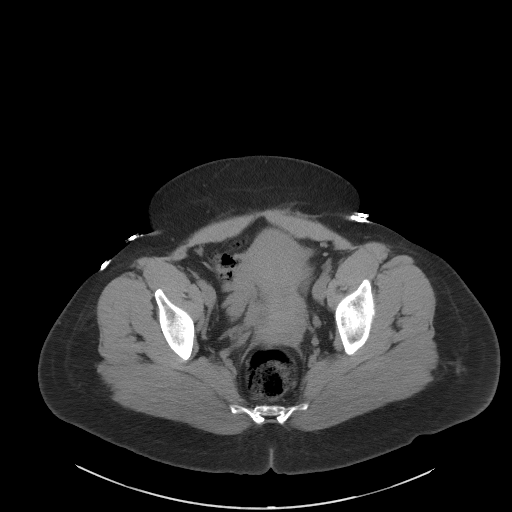
[im 32/98  soft-tissue]
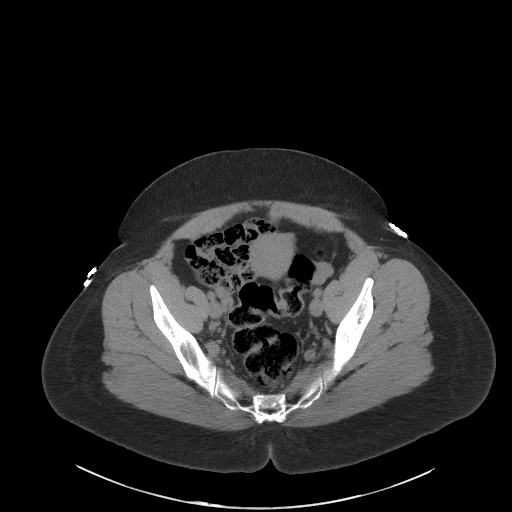
[im 39/98  soft-tissue]
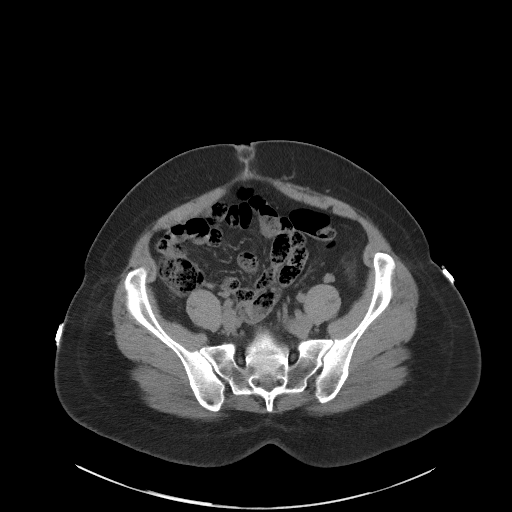
[im 47/98  soft-tissue]
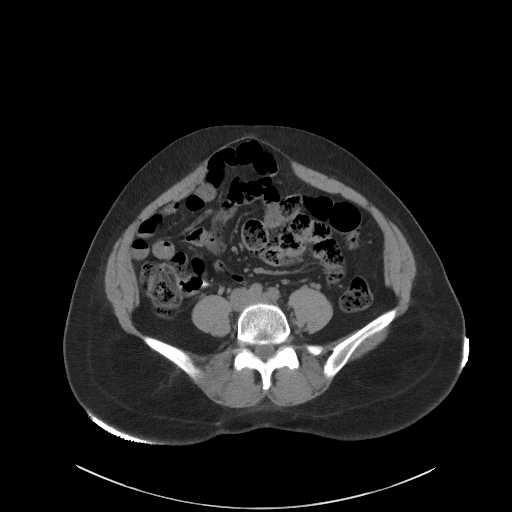
[im 51/98  soft-tissue]
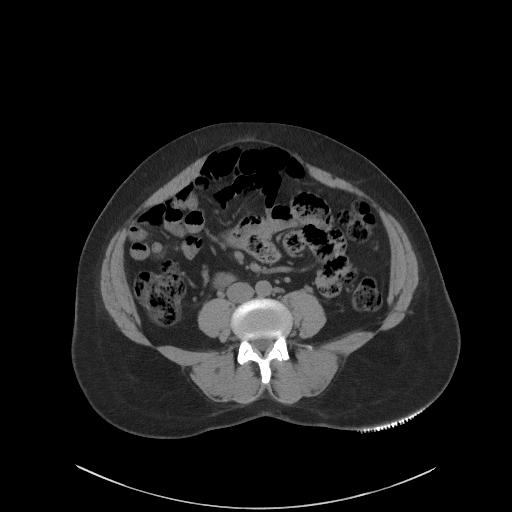
[im 59/98  soft-tissue]
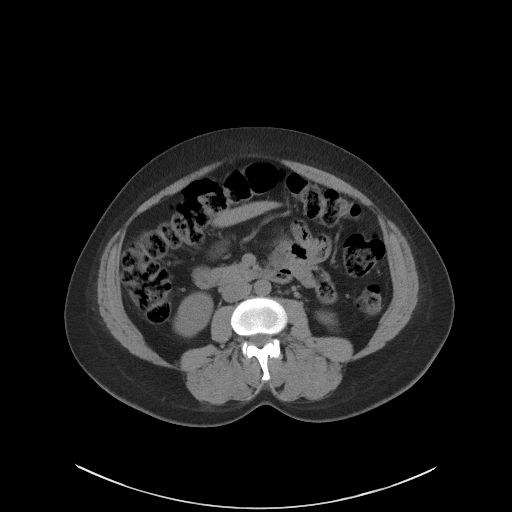
[im 59/98  bone]
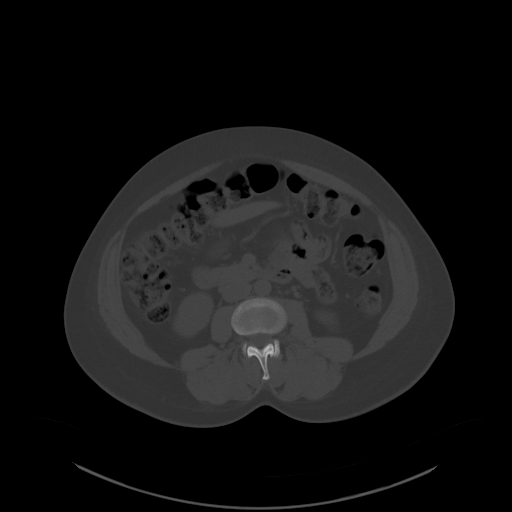
[im 66/98  soft-tissue]
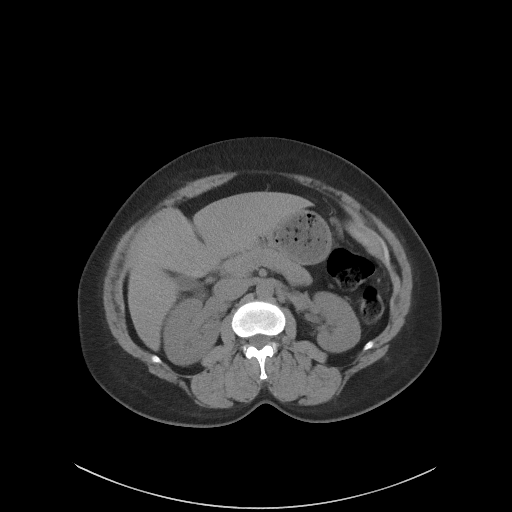
[im 74/98  soft-tissue]
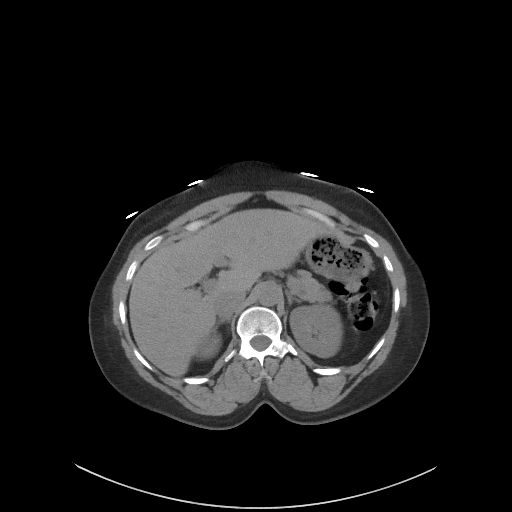
[im 78/98  soft-tissue]
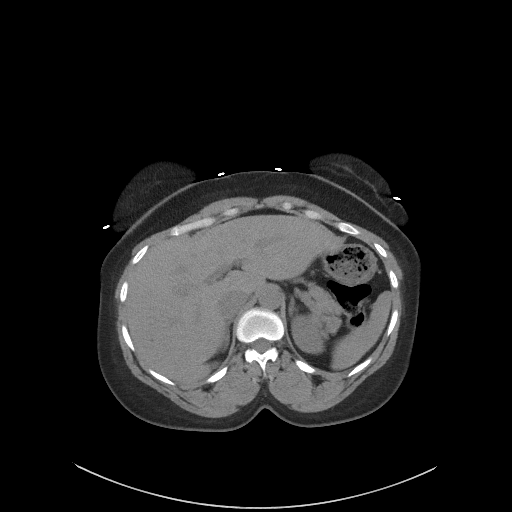
[im 86/98  soft-tissue]
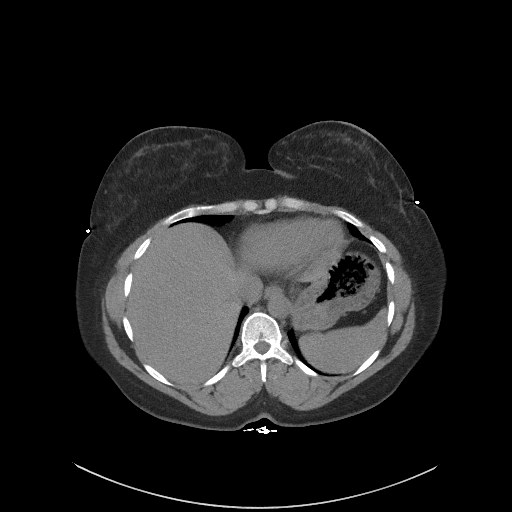
[im 94/98  soft-tissue]
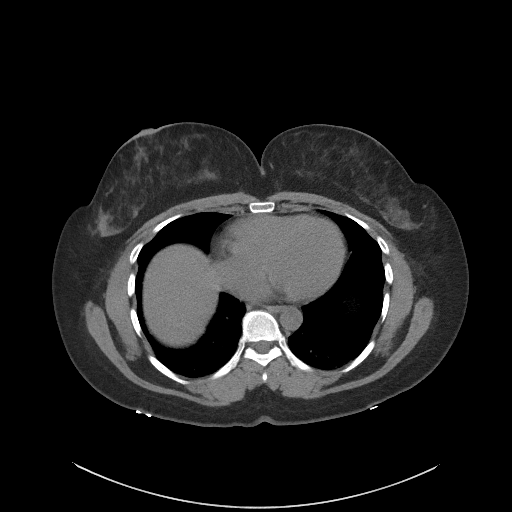

[Series 5: coronal st · coronal · 0.95mm/px · 3 of 87 slices shown]
[im 29/87  soft-tissue]
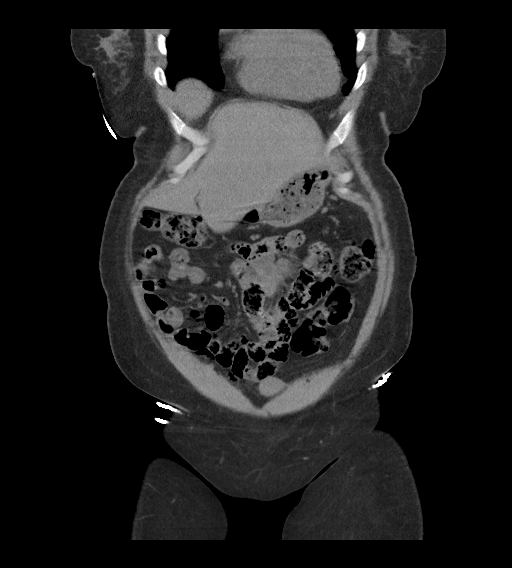
[im 39/87  soft-tissue]
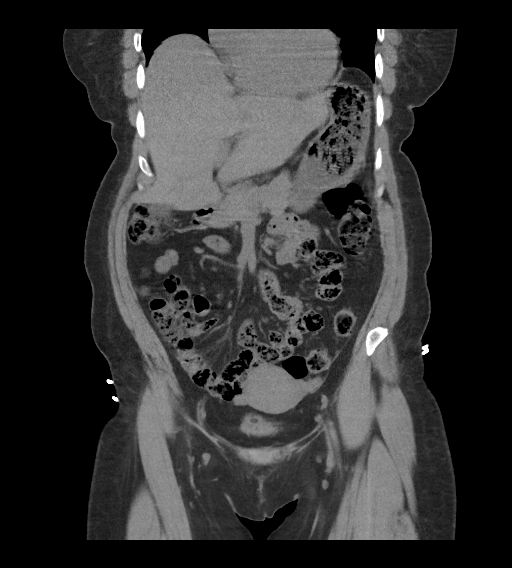
[im 48/87  soft-tissue]
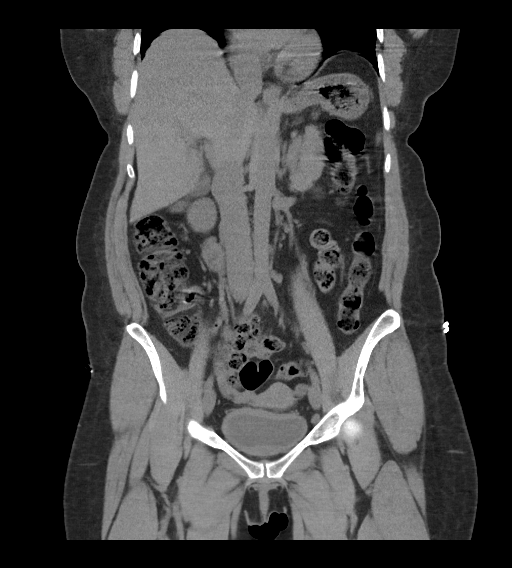

[17 of 46 positions shown; findings below may reference images not displayed]

FINDINGS: Lower chest:  Lung bases clear.  Heart size normal

Hepatobiliary: Liver normal in size and contour without focal
lesion. Gallbladder and bile ducts normal.

Pancreas: Negative

Spleen: Negative

Adrenals/Urinary Tract: No renal calculi. No renal obstruction or
mass. Thickening of the urinary bladder may represent cystitis.

Stomach/Bowel: Stomach and duodenum normal. Negative for bowel
obstruction. Normal appendix. Negative for diverticulitis. No
colonic edema.

Vascular/Lymphatic: Normal aorta and IVC.  No lymphadenopathy.

Reproductive: Prior C-section with scarring in the lower uterine
segment. No pelvic mass lesion.

Other: Ventral hernia in the midline containing bowel and fat. No
edema in the hernia.

Musculoskeletal: No skeletal abnormality.  Normal lumbar spine
IMPRESSION: Negative for urinary tract calculi.

Bladder wall thickening compatible with cystitis.

Normal appendix.  Negative for diverticulitis.

Ventral hernia in the midline without acute abnormality.
